# Patient Record
Sex: Male | Born: 1950 | Race: Black or African American | Hispanic: No | Marital: Single | Smoking: Never smoker
Health system: Southern US, Community
[De-identification: ages and names within clinical notes are randomized; demographics above are authoritative.]

## PROBLEM LIST (undated history)

## (undated) DIAGNOSIS — I10 Essential (primary) hypertension: Secondary | ICD-10-CM

---

## 2006-06-26 ENCOUNTER — Emergency Department (HOSPITAL_COMMUNITY): Admission: EM | Admit: 2006-06-26 | Discharge: 2006-06-26 | Payer: Self-pay | Admitting: Emergency Medicine

## 2008-11-30 ENCOUNTER — Emergency Department (HOSPITAL_COMMUNITY): Admission: EM | Admit: 2008-11-30 | Discharge: 2008-11-30 | Payer: Self-pay | Admitting: Emergency Medicine

## 2008-12-06 ENCOUNTER — Ambulatory Visit (HOSPITAL_COMMUNITY): Admission: RE | Admit: 2008-12-06 | Discharge: 2008-12-06 | Payer: Self-pay | Admitting: General Surgery

## 2011-02-22 LAB — COMPREHENSIVE METABOLIC PANEL
ALT: 17 U/L (ref 0–53)
Alkaline Phosphatase: 63 U/L (ref 39–117)
BUN: 14 mg/dL (ref 6–23)
CO2: 26 mEq/L (ref 19–32)
GFR calc non Af Amer: >60 mL/min (ref >60)
Glucose, Bld: 71 mg/dL (ref 70–99)
Potassium: 3.4 mEq/L — ABNORMAL LOW (ref 3.5–5.1)
Total Bilirubin: 0.8 mg/dL (ref 0.3–1.2)
Total Protein: 7 g/dL (ref 6.0–8.3)

## 2011-02-22 LAB — URINALYSIS, ROUTINE W REFLEX MICROSCOPIC
Bilirubin Urine: NEGATIVE
Glucose, UA: NEGATIVE mg/dL
Hgb urine dipstick: NEGATIVE
Protein, ur: NEGATIVE mg/dL
Specific Gravity, Urine: 1.022 (ref 1.005–1.030)
Urobilinogen, UA: 1 mg/dL (ref 0.0–1.0)

## 2011-02-22 LAB — CBC
HCT: 41.8 % (ref 39.0–52.0)
Hemoglobin: 13.2 g/dL (ref 13.0–17.0)
RBC: 5.79 MIL/uL (ref 4.22–5.81)
RDW: 14.1 % (ref 11.5–15.5)

## 2011-02-22 LAB — DIFFERENTIAL
Basophils Absolute: 0 10*3/uL (ref 0.0–0.1)
Basophils Relative: 0 % (ref 0–1)
Eosinophils Absolute: 0.2 10*3/uL (ref 0.0–0.7)
Monocytes Relative: 10 % (ref 3–12)
Neutro Abs: 1.3 10*3/uL — ABNORMAL LOW (ref 1.7–7.7)
Neutrophils Relative %: 23 % — ABNORMAL LOW (ref 43–77)

## 2011-02-22 LAB — URINE MICROSCOPIC-ADD ON

## 2011-03-23 NOTE — Op Note (Signed)
Ricardo Horton                ACCOUNT NO.:  [id number]   MEDICAL RECORD NO.:  [id number]          PATIENT TYPE:  AMB   LOCATION:  DAY                          FACILITY:  [facility]   PHYSICIAN:  Angelia Mould. Derrell Lolling, M.D.DATE OF BIRTH:  [birth year]   DATE OF PROCEDURE:  12/06/2008  DATE OF DISCHARGE:                               OPERATIVE REPORT   PREOPERATIVE DIAGNOSIS:  Right inguinal hernia.   POSTOPERATIVE DIAGNOSIS:  Right inguinal hernia.   OPERATION PERFORMED:  Repair right inguinal hernia with Ultrapro mesh  (Lichtenstein repair).   SURGEON:  Dr. Claud Kelp.   OPERATIVE INDICATIONS:  This is a 60 year old African American gentleman  from [place].  He presents with a 2-week history of a painful mass  in his right groin.  On exam, he is a thin muscular gentleman who has a  medium-sized right inguinal hernia that is somewhat tender and  protruding toward the scrotum that is reducible when supine.  He was  counseled as an outpatient.  He was brought to operating room  electively.   OPERATIVE TECHNIQUE:  Following the induction of general endotracheal  anesthesia, the patient's lower abdomen and genitalia and upper thighs  were prepped and draped in a sterile fashion.  Intravenous antibiotics  were given.  Surgical time out was held, and the patient was identified  as correct patient, correct procedure and correct site.  Marcaine 0.25%  with epinephrine was used as a local infiltration anesthetic.  Transverse incision was made in the right groin overlying the right  inguinal canal.  Dissection was carried down through subcutaneous tissue  exposing the aponeurosis of the external oblique.  The external inguinal  ring was identified.  The external oblique was incised in the direction  of its fibers opening up the external ring.  The external oblique was  dissected away from the underlying tissues, and self-retaining  retractors were placed.  We mobilized the cord  structures and encircled  them with a Penrose drain.  Cord structures were relatively thick.  Cremasteric muscle fibers were carefully dissected and divided and with  some blunt dissection pushed away both proximal and distal.  We found he  had a very large indirect hernia sac.  We spent some time dissecting  this indirect hernia sac away from the cord structures.  We had good  visualization.  This large sac was empty.  Once we had dissected the  indirect sac all the way back to level the internal ring, we opened the  indirect sac and inspected within and palpated within and found no  adhesions or intestinal contents.  The indirect sac was then twisted and  then suture ligated at the level of the internal ring with suture  ligature of 2-0 silk.  The redundant sac was excised and discarded.  There was no bleeding.  A 3-inch x 6-inch piece of Ultrapro mesh was  brought into the operative field and trimmed at the corners to  accommodate the wound.  We used this as an onlay graft to reinforce the  muscle layers.  The mesh was sutured in  place with interrupted mattress  sutures of 2-0 Prolene and running sutures of 2-0 Prolene.  The mesh was  sutured so as to generously overlap the fascia at the pubic tubercle,  then along the inguinal ligament inferiorly.  Medially and  superomedially about 6 or 7 interrupted sutures of 2-0 Prolene were  placed to secure the mesh.  Superolaterally, a running suture of 2-0  Prolene was used.  The mesh was incised laterally so as to wrap around  the cord structures at the internal ring.  The tails of the mesh were  overlapped laterally and the suture lines completed.  This provided a  very secure repair both medial and lateral to the cord structures but  allowed an adequate opening for the cord structures.  The wound was  irrigated with saline.  There was no bleeding.  The external oblique was  closed with a running suture of 2-0 Vicryl.  Scarpa's fascia was  closed  with 3-0 Vicryl sutures and the skin closed with a running subcuticular  suture of  4-0 Monocryl and Dermabond.  Clean bandages were placed, and the patient  taken to recovery in stable condition.  Estimated blood loss was about  10 mL.  Complications were none.  Sponge, needle, and instrument counts  were correct.      Angelia Mould. Derrell Lolling, M.D.  Electronically Signed     HMI/MEDQ  D:  12/06/2008  T:  12/06/2008  Job:  [id number]   cc:   Azalia Bilis, MD

## 2011-06-25 ENCOUNTER — Emergency Department (HOSPITAL_COMMUNITY)
Admission: EM | Admit: 2011-06-25 | Discharge: 2011-06-25 | Discharge status: Against Medical Advice | Payer: Self-pay | Attending: Emergency Medicine | Admitting: Emergency Medicine

## 2011-06-25 DIAGNOSIS — Z043 Encounter for examination and observation following other accident: Secondary | ICD-10-CM | POA: Insufficient documentation

## 2012-11-29 ENCOUNTER — Other Ambulatory Visit: Payer: Self-pay | Admitting: Family Medicine

## 2012-11-29 ENCOUNTER — Ambulatory Visit
Admission: RE | Admit: 2012-11-29 | Discharge: 2012-11-29 | Disposition: A | Discharge status: Home / Self Care | Payer: Medicaid Other | Source: Ambulatory Visit | Attending: Family Medicine | Admitting: Family Medicine

## 2012-11-29 DIAGNOSIS — M549 Dorsalgia, unspecified: Secondary | ICD-10-CM

## 2013-07-06 ENCOUNTER — Emergency Department (HOSPITAL_COMMUNITY): Payer: Self-pay

## 2013-07-06 ENCOUNTER — Emergency Department (HOSPITAL_COMMUNITY)
Admission: EM | Admit: 2013-07-06 | Discharge: 2013-07-06 | Disposition: A | Discharge status: Home / Self Care | Payer: Self-pay | Attending: Emergency Medicine | Admitting: Emergency Medicine

## 2013-07-06 DIAGNOSIS — S0993XA Unspecified injury of face, initial encounter: Secondary | ICD-10-CM | POA: Insufficient documentation

## 2013-07-06 DIAGNOSIS — IMO0002 Reserved for concepts with insufficient information to code with codable children: Secondary | ICD-10-CM

## 2013-07-06 DIAGNOSIS — T07XXXA Unspecified multiple injuries, initial encounter: Secondary | ICD-10-CM | POA: Insufficient documentation

## 2013-07-06 DIAGNOSIS — S8990XA Unspecified injury of unspecified lower leg, initial encounter: Secondary | ICD-10-CM | POA: Insufficient documentation

## 2013-07-06 DIAGNOSIS — S298XXA Other specified injuries of thorax, initial encounter: Secondary | ICD-10-CM | POA: Insufficient documentation

## 2013-07-06 DIAGNOSIS — S32009A Unspecified fracture of unspecified lumbar vertebra, initial encounter for closed fracture: Secondary | ICD-10-CM | POA: Insufficient documentation

## 2013-07-06 DIAGNOSIS — S0990XA Unspecified injury of head, initial encounter: Secondary | ICD-10-CM | POA: Insufficient documentation

## 2013-07-06 DIAGNOSIS — R404 Transient alteration of awareness: Secondary | ICD-10-CM | POA: Insufficient documentation

## 2013-07-06 LAB — COMPREHENSIVE METABOLIC PANEL
ALT: 24 U/L (ref 0–53)
AST: 60 U/L — ABNORMAL HIGH (ref 0–37)
Alkaline Phosphatase: 57 U/L (ref 39–117)
CO2: 26 mEq/L (ref 19–32)
Calcium: 8.9 mg/dL (ref 8.4–10.5)
GFR calc Af Amer: 67 mL/min — ABNORMAL LOW (ref >90)
GFR calc non Af Amer: 57 mL/min — ABNORMAL LOW (ref >90)
Glucose, Bld: 92 mg/dL (ref 70–99)
Potassium: 3.5 mEq/L (ref 3.5–5.1)
Sodium: 137 mEq/L (ref 135–145)
Total Protein: 6.8 g/dL (ref 6.0–8.3)

## 2013-07-06 LAB — CBC WITH DIFFERENTIAL/PLATELET
Basophils Relative: 0 % (ref 0–1)
Eosinophils Relative: 2 % (ref 0–5)
Hemoglobin: 13 g/dL (ref 13.0–17.0)
Lymphocytes Relative: 32 % (ref 12–46)
Monocytes Absolute: 0.5 10*3/uL (ref 0.1–1.0)
Monocytes Relative: 10 % (ref 3–12)
Neutrophils Relative %: 56 % (ref 43–77)
Platelets: 150 10*3/uL (ref 150–400)
RBC: 5.7 MIL/uL (ref 4.22–5.81)
WBC: 5 10*3/uL (ref 4.0–10.5)

## 2013-07-06 MED ORDER — MORPHINE SULFATE 4 MG/ML IJ SOLN
4.0000 mg | Freq: Once | INTRAMUSCULAR | Status: AC
  Administered 2013-07-06: 4 mg via INTRAVENOUS
  Filled 2013-07-06: qty 1

## 2013-07-06 MED ORDER — OXYCODONE-ACETAMINOPHEN 5-325 MG PO TABS: 1.0000 | ORAL_TABLET | ORAL | Status: AC | PRN

## 2013-07-06 MED ORDER — IOHEXOL 300 MG/ML  SOLN
100.0000 mL | Freq: Once | INTRAMUSCULAR | Status: AC | PRN
  Administered 2013-07-06: 100 mL via INTRAVENOUS

## 2013-07-06 NOTE — ED Notes (Signed)
Pt states he has hemoptysis

## 2013-07-06 NOTE — ED Notes (Signed)
Patient transported to CT 

## 2013-07-06 NOTE — ED Notes (Signed)
Pain medication given. Call bell in reach. Pt advised not to get out of bed and to use call bed if he needs to get up. Pt verbalized understanding.

## 2013-07-06 NOTE — ED Notes (Signed)
[employer] at bedside 
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
## 2013-07-06 NOTE — ED Notes (Signed)
Pt has given urine sample if needed. 

## 2013-07-06 NOTE — ED Notes (Signed)
MD at bedside. 
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
## 2013-07-06 NOTE — ED Notes (Signed)
Pt keeps stating"my brain. I can feel my brain!!"
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
## 2014-10-25 ENCOUNTER — Emergency Department (HOSPITAL_COMMUNITY): Payer: Medicaid Other

## 2014-10-25 ENCOUNTER — Emergency Department (HOSPITAL_COMMUNITY)
Admission: EM | Admit: 2014-10-25 | Discharge: 2014-10-25 | Disposition: A | Discharge status: Home / Self Care | Payer: Medicaid Other | Attending: Emergency Medicine | Admitting: Emergency Medicine

## 2014-10-25 ENCOUNTER — Encounter (HOSPITAL_COMMUNITY): Payer: Self-pay | Admitting: Emergency Medicine

## 2014-10-25 DIAGNOSIS — I10 Essential (primary) hypertension: Secondary | ICD-10-CM | POA: Insufficient documentation

## 2014-10-25 DIAGNOSIS — R4182 Altered mental status, unspecified: Secondary | ICD-10-CM

## 2014-10-25 HISTORY — DX: Essential (primary) hypertension: I10

## 2014-10-25 LAB — URINALYSIS, ROUTINE W REFLEX MICROSCOPIC
Bilirubin Urine: NEGATIVE
GLUCOSE, UA: NEGATIVE mg/dL
Hgb urine dipstick: NEGATIVE
KETONES UR: NEGATIVE mg/dL
LEUKOCYTES UA: NEGATIVE
NITRITE: NEGATIVE
PH: 7 (ref 5.0–8.0)
Protein, ur: NEGATIVE mg/dL
Specific Gravity, Urine: 1.013 (ref 1.005–1.030)
Urobilinogen, UA: 0.2 mg/dL (ref 0.0–1.0)

## 2014-10-25 LAB — CBC WITH DIFFERENTIAL/PLATELET
Basophils Absolute: 0 10*3/uL (ref 0.0–0.1)
Basophils Relative: 1 % (ref 0–1)
EOS PCT: 5 % (ref 0–5)
Eosinophils Absolute: 0.2 10*3/uL (ref 0.0–0.7)
HCT: 44.4 % (ref 39.0–52.0)
Hemoglobin: 14.6 g/dL (ref 13.0–17.0)
LYMPHS ABS: 2 10*3/uL (ref 0.7–4.0)
LYMPHS PCT: 52 % — AB (ref 12–46)
MCH: 22.5 pg — ABNORMAL LOW (ref 26.0–34.0)
MCHC: 32.9 g/dL (ref 30.0–36.0)
MCV: 68.5 fL — AB (ref 78.0–100.0)
MONOS PCT: 12 % (ref 3–12)
Monocytes Absolute: 0.5 10*3/uL (ref 0.1–1.0)
NEUTROS PCT: 30 % — AB (ref 43–77)
Neutro Abs: 1.2 10*3/uL — ABNORMAL LOW (ref 1.7–7.7)
PLATELETS: 197 10*3/uL (ref 150–400)
RBC: 6.48 MIL/uL — AB (ref 4.22–5.81)
RDW: 12.7 % (ref 11.5–15.5)
WBC: 3.9 10*3/uL — AB (ref 4.0–10.5)

## 2014-10-25 LAB — TROPONIN I

## 2014-10-25 LAB — COMPREHENSIVE METABOLIC PANEL
ALK PHOS: 81 U/L (ref 39–117)
ALT: 16 U/L (ref 0–53)
AST: 19 U/L (ref 0–37)
Albumin: 4.4 g/dL (ref 3.5–5.2)
Anion gap: 12 (ref 5–15)
BILIRUBIN TOTAL: 0.5 mg/dL (ref 0.3–1.2)
BUN: 13 mg/dL (ref 6–23)
CHLORIDE: 97 meq/L (ref 96–112)
CO2: 29 meq/L (ref 19–32)
Calcium: 10 mg/dL (ref 8.4–10.5)
Creatinine, Ser: 1.55 mg/dL — ABNORMAL HIGH (ref 0.50–1.35)
GFR calc Af Amer: 53 mL/min — ABNORMAL LOW (ref >90)
GFR, EST NON AFRICAN AMERICAN: 46 mL/min — AB (ref >90)
Glucose, Bld: 107 mg/dL — ABNORMAL HIGH (ref 70–99)
POTASSIUM: 3.7 meq/L (ref 3.7–5.3)
SODIUM: 138 meq/L (ref 137–147)
Total Protein: 8.4 g/dL — ABNORMAL HIGH (ref 6.0–8.3)

## 2014-10-25 LAB — ETHANOL: Alcohol, Ethyl (B): <11 mg/dL (ref 0–11)

## 2014-10-25 LAB — RAPID URINE DRUG SCREEN, HOSP PERFORMED
AMPHETAMINES: NOT DETECTED
Barbiturates: NOT DETECTED
Benzodiazepines: NOT DETECTED
Cocaine: NOT DETECTED
Opiates: NOT DETECTED
TETRAHYDROCANNABINOL: NOT DETECTED

## 2014-10-25 MED ORDER — NALOXONE HCL 0.4 MG/ML IJ SOLN
0.4000 mg | Freq: Once | INTRAMUSCULAR | Status: AC
  Administered 2014-10-25: 0.4 mg via INTRAVENOUS
  Filled 2014-10-25: qty 1
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
## 2014-10-25 NOTE — Discharge Instructions (Signed)

## 2016-03-05 IMAGING — CR DG CHEST 1V PORT
1 series · 1 of 1 positions shown · non-contrast
Comparison: None.

CLINICAL DATA: Altered mental status.  History of hypertension.

EXAM:
PORTABLE CHEST - 1 VIEW

[AP]
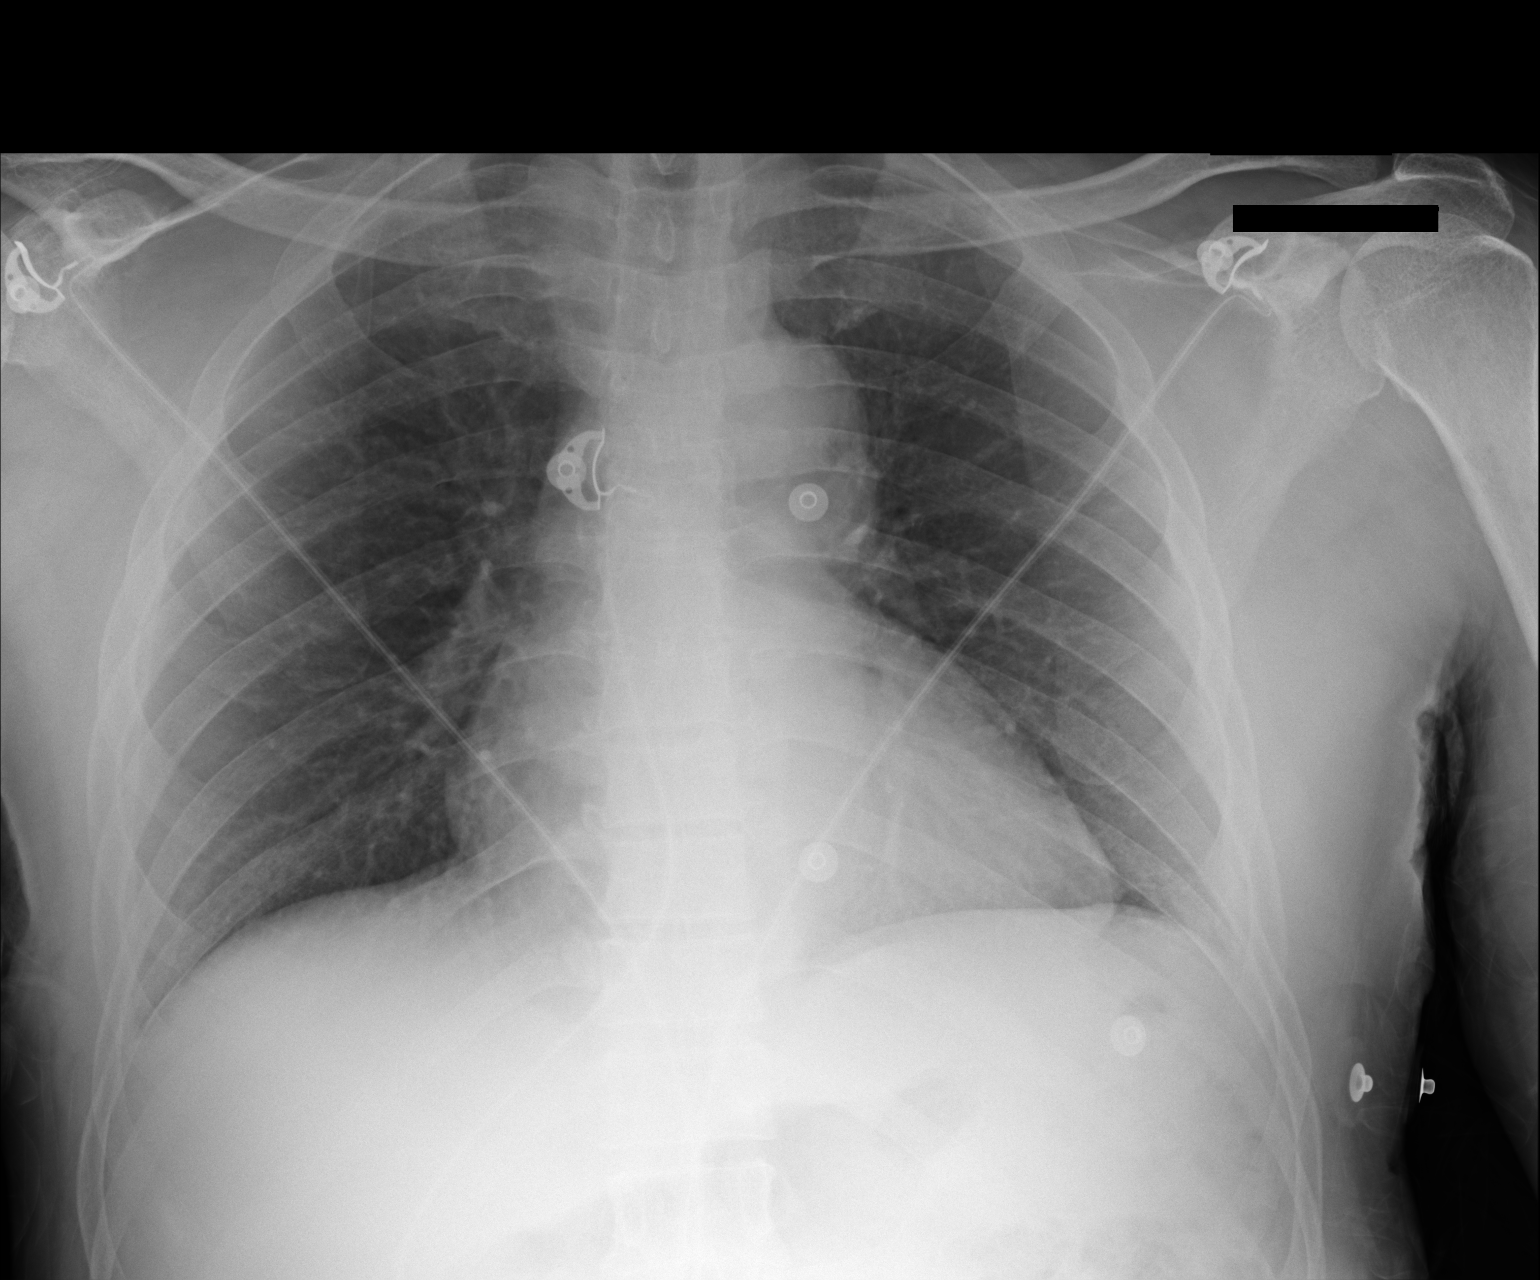

[1 of 1 positions shown; findings below may reference images not displayed]

FINDINGS: The heart size and mediastinal contours are within normal limits.
Both lungs are clear. The visualized skeletal structures are
unremarkable.
IMPRESSION: No active disease.

## 2016-03-05 IMAGING — CT CT HEAD W/O CM
1 series · 16 of 29 positions shown, 20 images · non-contrast
Comparison: 07/06/2013

CLINICAL DATA: Altered mental status.  Unresponsive.

EXAM:
CT HEAD WITHOUT CONTRAST
TECHNIQUE: Contiguous axial images were obtained from the base of the skull
through the vertex without intravenous contrast.

[Series 2: head 5.0 h30s · axial · 0.47mm/px · z∈[-87,+43]mm · 16 of 29 slices shown, 20 images]
[im 2/29  brain]
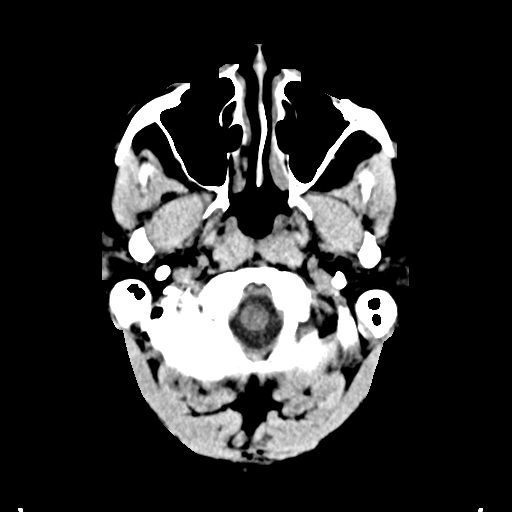
[im 2/29  bone]
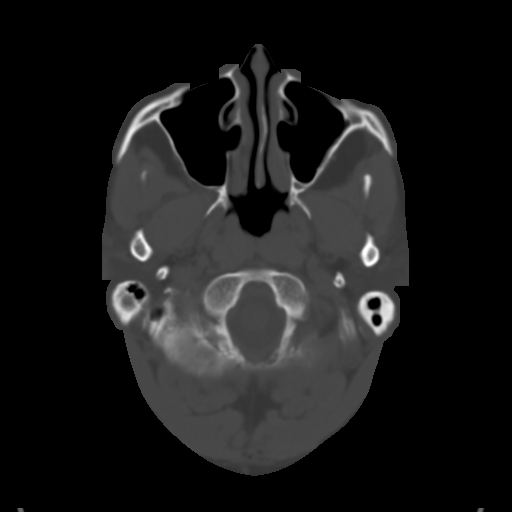
[im 4/29  brain]
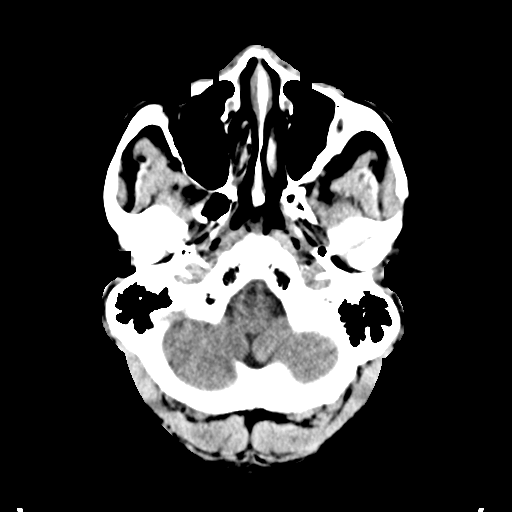
[im 6/29  brain]
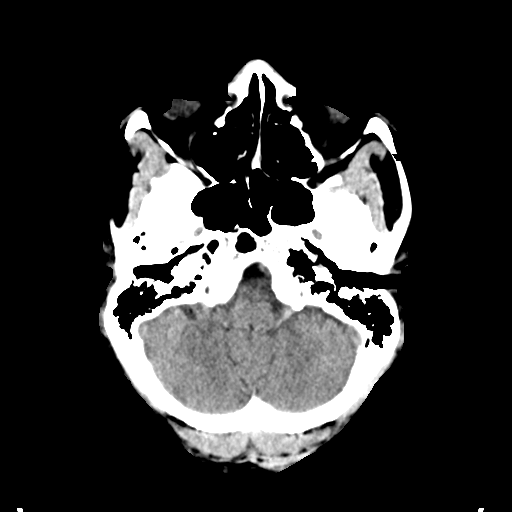
[im 7/29  brain]
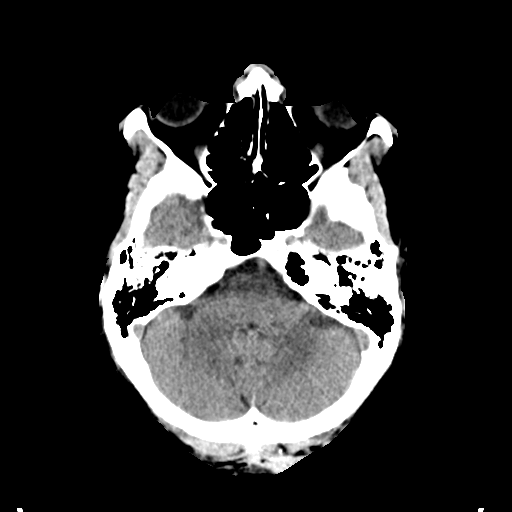
[im 9/29  brain]
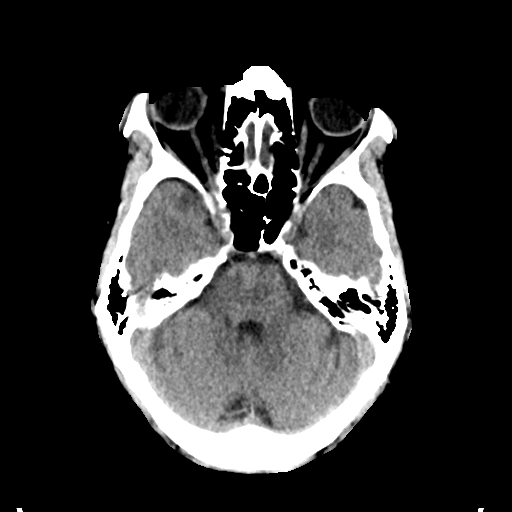
[im 9/29  bone]
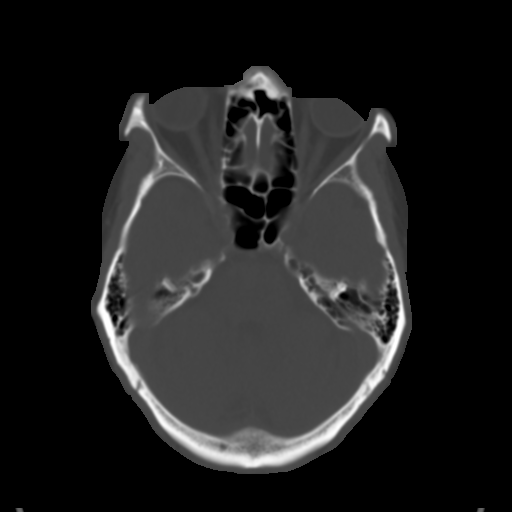
[im 11/29  brain]
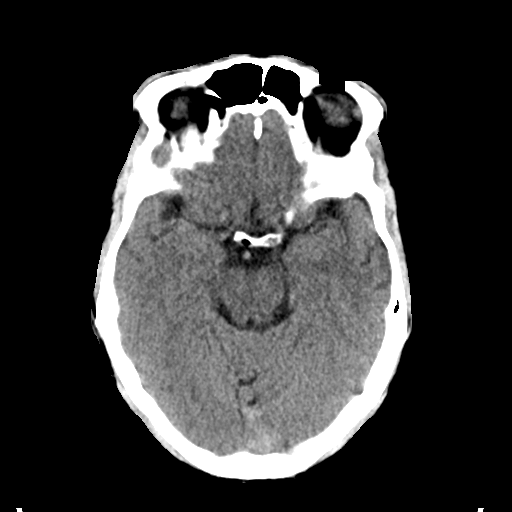
[im 12/29  brain]
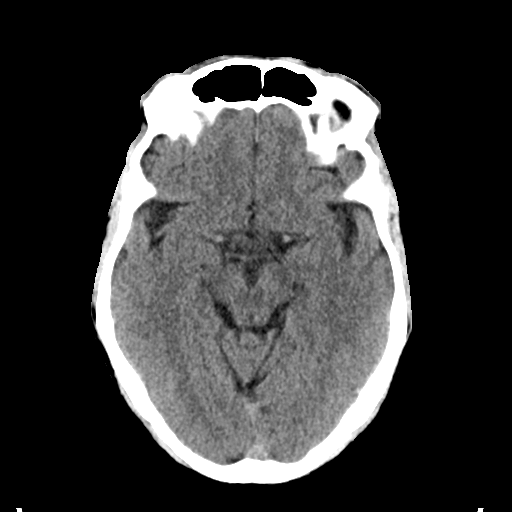
[im 14/29  brain]
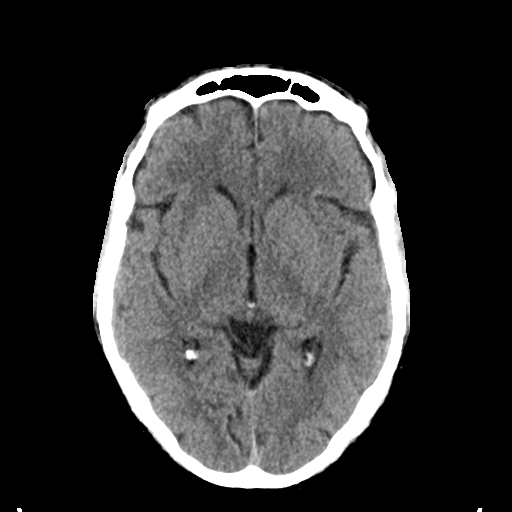
[im 16/29  brain]
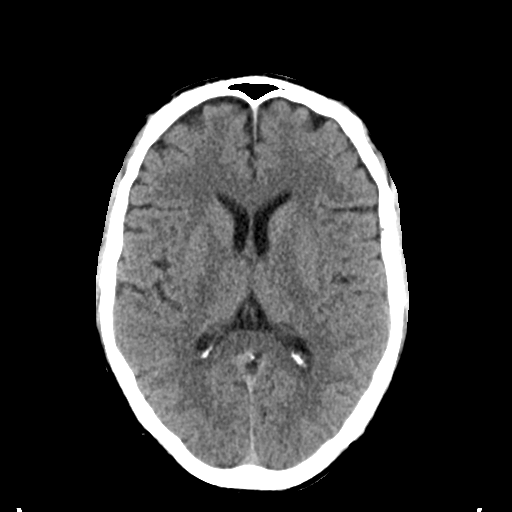
[im 16/29  bone]
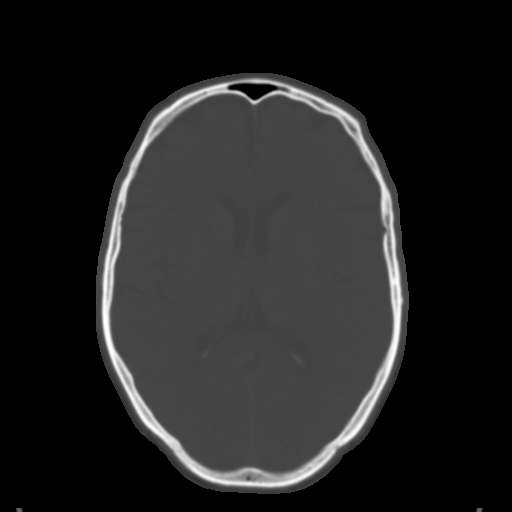
[im 18/29  brain]
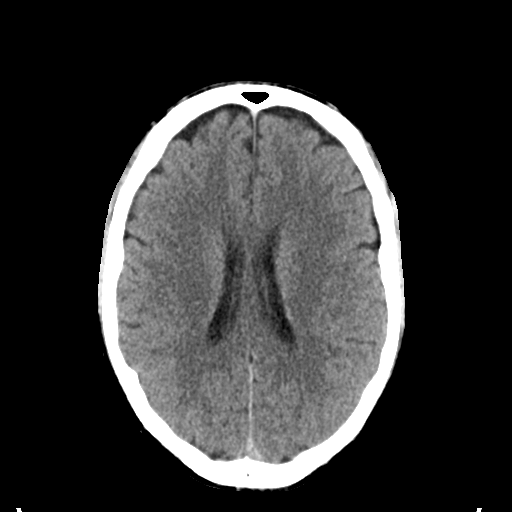
[im 19/29  brain]
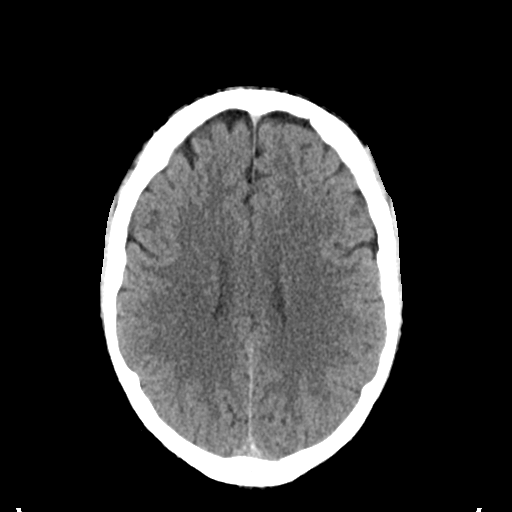
[im 21/29  brain]
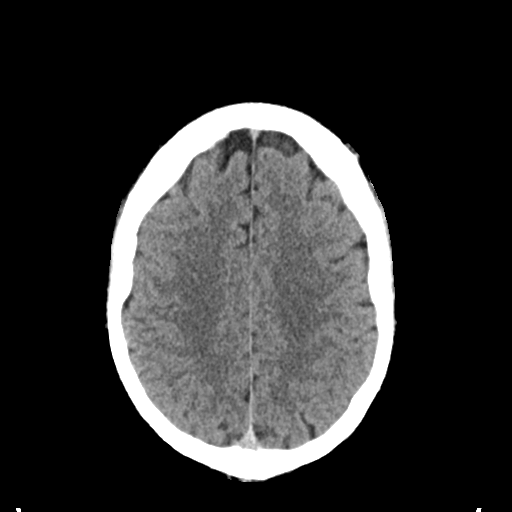
[im 23/29  brain]
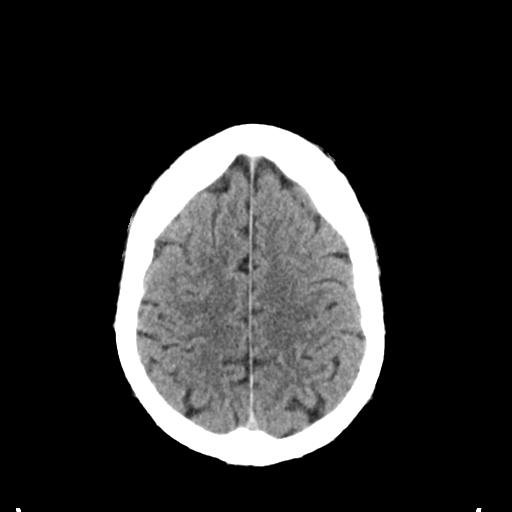
[im 23/29  bone]
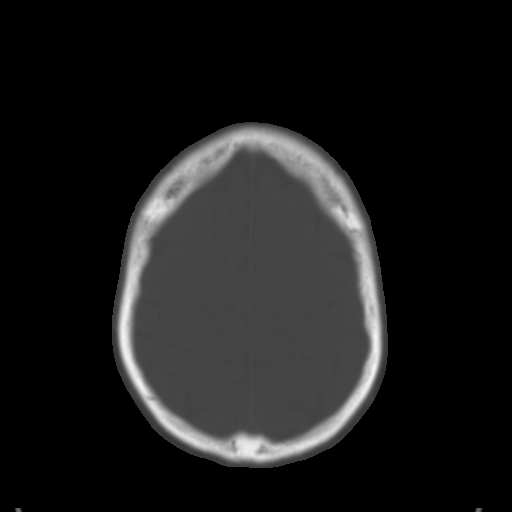
[im 24/29  brain]
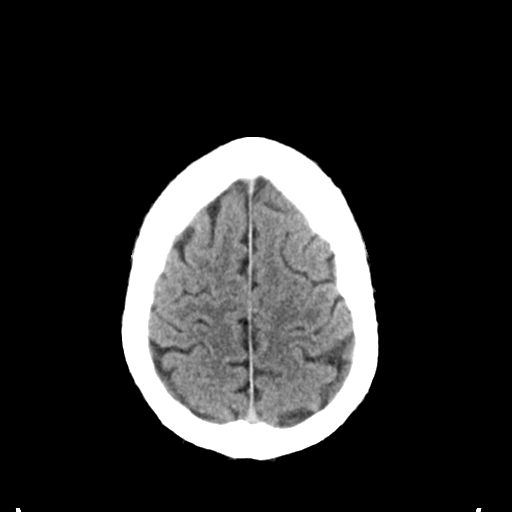
[im 26/29  brain]
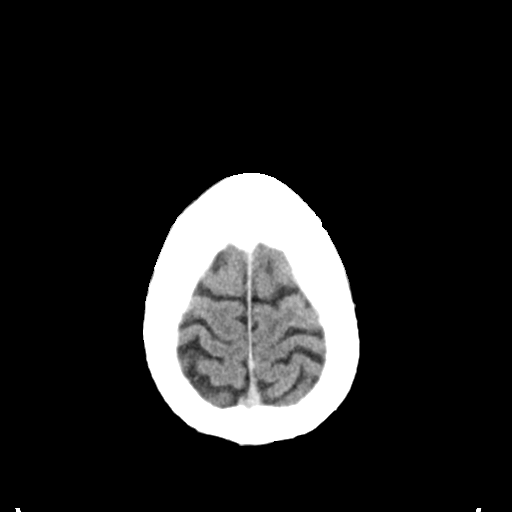
[im 28/29  brain]
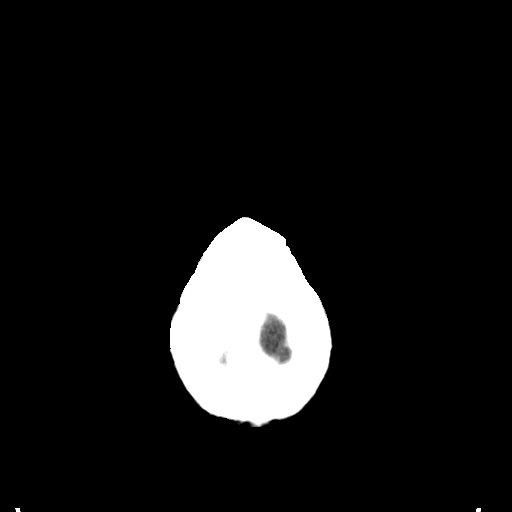

[16 of 29 positions shown; findings below may reference images not displayed]

FINDINGS: No acute intracranial abnormality. Specifically, no hemorrhage,
hydrocephalus, mass lesion, acute infarction, or significant
intracranial injury. No acute calvarial abnormality. Visualized
paranasal sinuses and mastoids clear. Orbital soft tissues
unremarkable.
IMPRESSION: Negative study.
